# Patient Record
Sex: Female | Born: 1965 | Race: Black or African American | Hispanic: No | Marital: Married | Smoking: Never smoker
Health system: Southern US, Community
[De-identification: ages and names within clinical notes are randomized; demographics above are authoritative.]

## PROBLEM LIST (undated history)

## (undated) HISTORY — PX: ABDOMINAL HYSTERECTOMY: SHX81

---

## 1999-05-02 ENCOUNTER — Other Ambulatory Visit: Admission: RE | Admit: 1999-05-02 | Discharge: 1999-05-02 | Payer: Self-pay | Admitting: Obstetrics

## 2000-09-09 ENCOUNTER — Other Ambulatory Visit: Admission: RE | Admit: 2000-09-09 | Discharge: 2000-09-09 | Payer: Self-pay | Admitting: Obstetrics

## 2001-01-15 ENCOUNTER — Ambulatory Visit (HOSPITAL_COMMUNITY): Admission: RE | Admit: 2001-01-15 | Discharge: 2001-01-15 | Payer: Self-pay | Admitting: Obstetrics

## 2001-01-15 ENCOUNTER — Encounter: Payer: Self-pay | Admitting: Obstetrics

## 2001-04-11 ENCOUNTER — Inpatient Hospital Stay (HOSPITAL_COMMUNITY): Admission: AD | Admit: 2001-04-11 | Discharge: 2001-04-13 | Payer: Self-pay | Admitting: Obstetrics

## 2005-04-13 ENCOUNTER — Ambulatory Visit (HOSPITAL_COMMUNITY): Admission: RE | Admit: 2005-04-13 | Discharge: 2005-04-13 | Payer: Self-pay | Admitting: Obstetrics

## 2007-11-12 ENCOUNTER — Other Ambulatory Visit: Admission: RE | Admit: 2007-11-12 | Discharge: 2007-11-12 | Payer: Self-pay | Admitting: Family Medicine

## 2007-11-14 ENCOUNTER — Encounter: Admission: RE | Admit: 2007-11-14 | Discharge: 2007-11-14 | Payer: Self-pay | Admitting: Family Medicine

## 2007-12-11 ENCOUNTER — Encounter (INDEPENDENT_AMBULATORY_CARE_PROVIDER_SITE_OTHER): Payer: Self-pay | Admitting: Obstetrics and Gynecology

## 2007-12-11 ENCOUNTER — Inpatient Hospital Stay (HOSPITAL_COMMUNITY): Admission: RE | Admit: 2007-12-11 | Discharge: 2007-12-14 | Payer: Self-pay | Admitting: Obstetrics and Gynecology

## 2008-10-22 ENCOUNTER — Ambulatory Visit (HOSPITAL_COMMUNITY): Admission: RE | Admit: 2008-10-22 | Discharge: 2008-10-22 | Payer: Self-pay | Admitting: Family Medicine

## 2009-04-26 IMAGING — US US TRANSVAGINAL NON-OB
1 series · 13 of 25 positions shown · non-contrast
Comparison: None

CLINICAL DATA: General pelvic pain

TRANSABDOMINAL AND TRANSVAGINAL ULTRASOUND OF PELVIS
TECHNIQUE: Both transabdominal and transvaginal ultrasound
examinations of the pelvis were performed including evaluation of
the uterus, ovaries, adnexal regions, and pelvic cul-de-sac.

[Series 1: us transvaginal non-ob · 0.39mm/px · 13 of 75 slices shown]
[im 1/75]
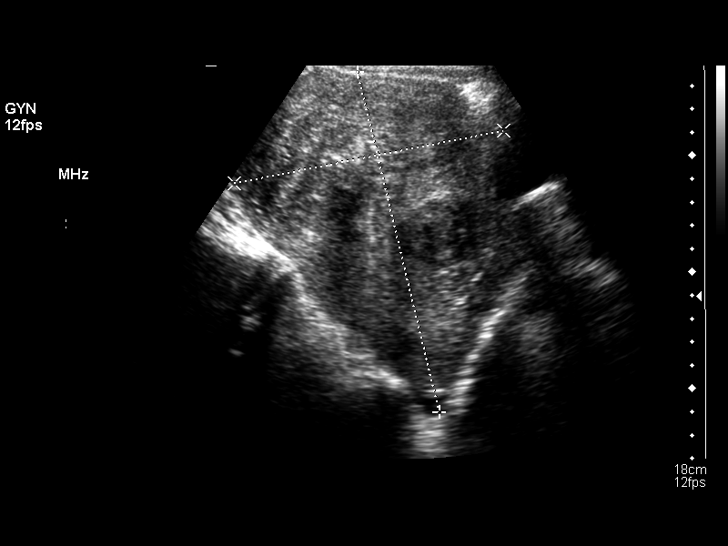
[im 7/75]
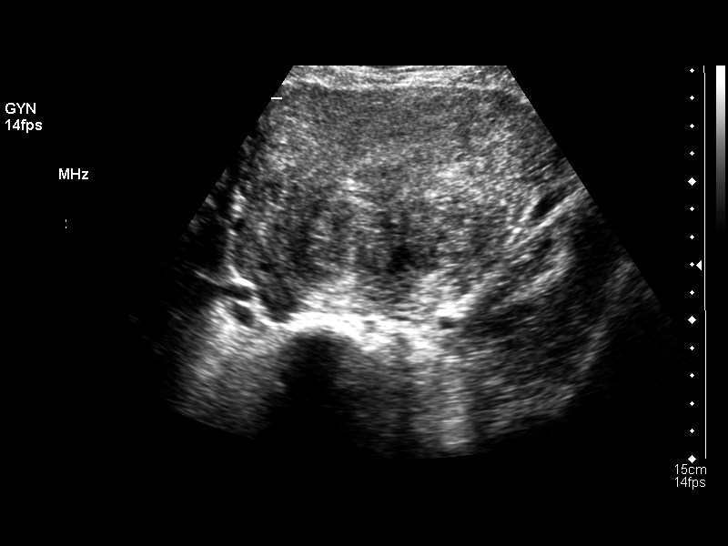
[im 13/75]
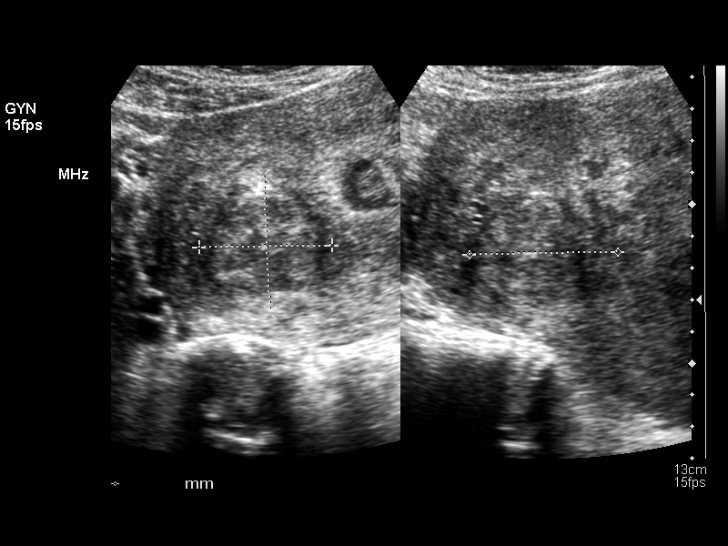
[im 19/75]
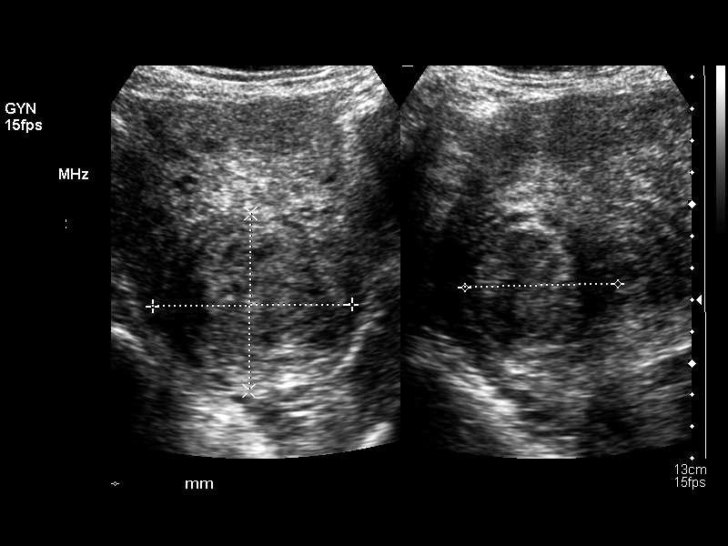
[im 25/75]
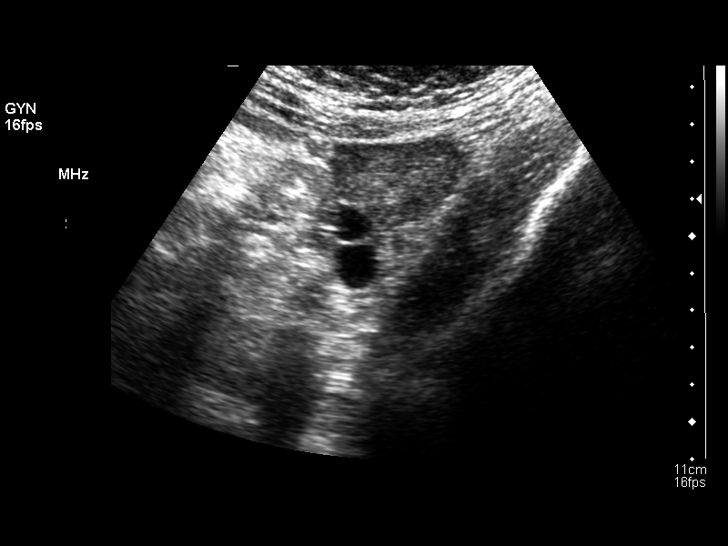
[im 31/75]
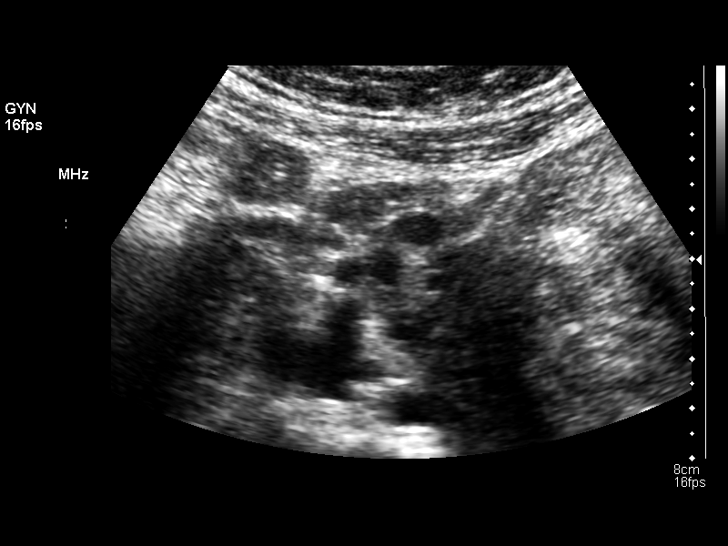
[im 38/75]
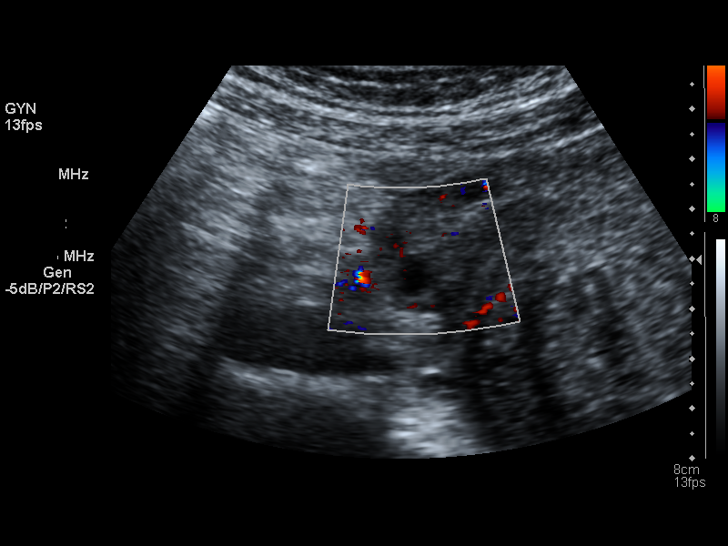
[im 44/75]
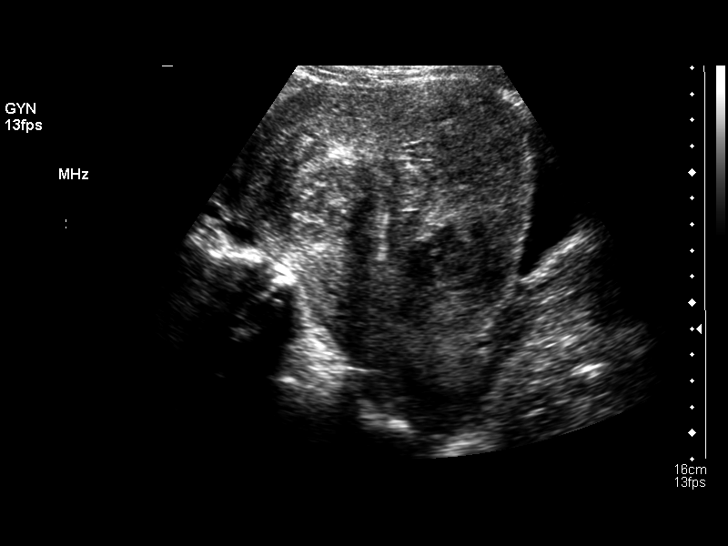
[im 50/75]
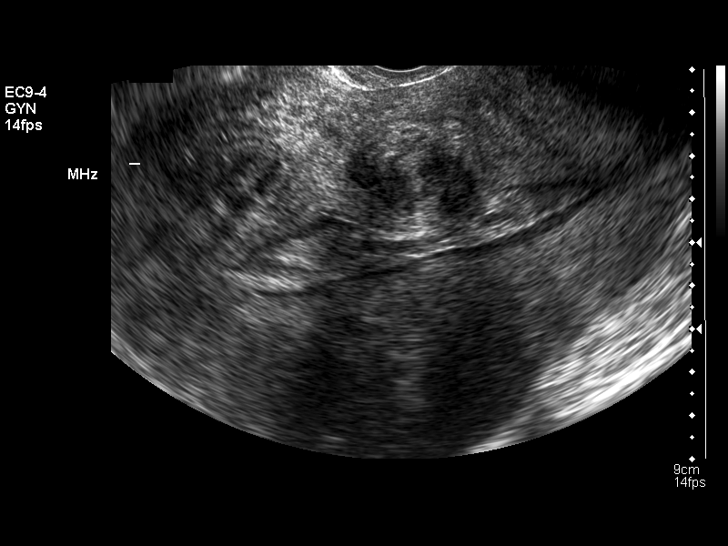
[im 56/75]
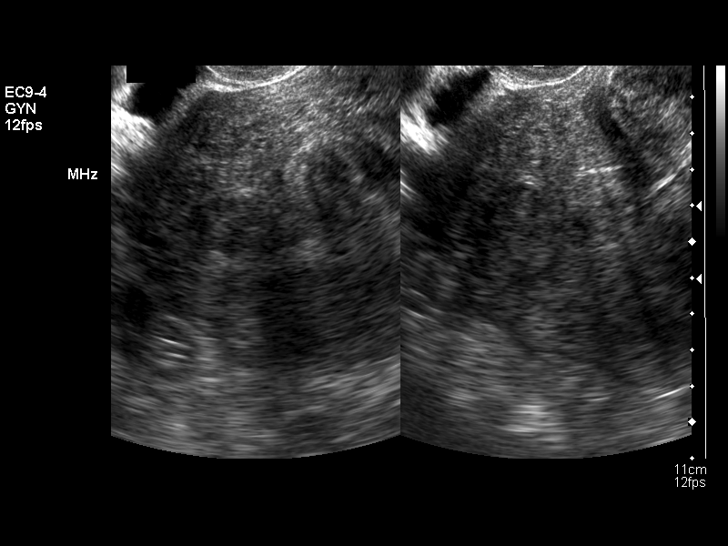
[im 62/75]
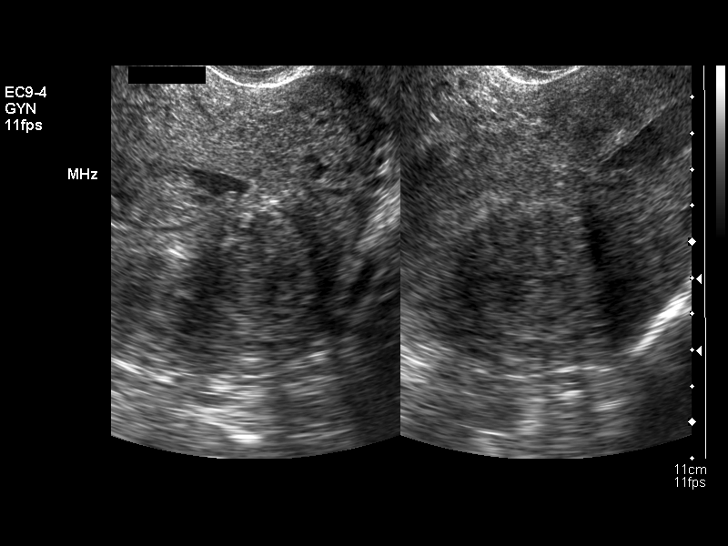
[im 68/75]
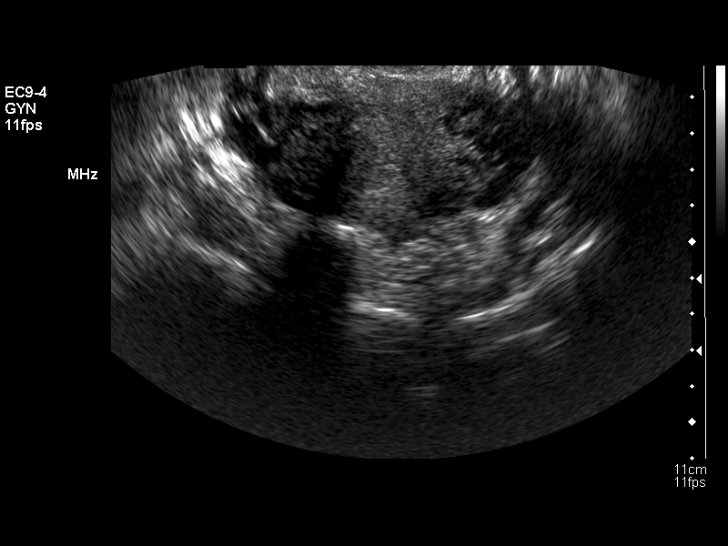
[im 75/75]
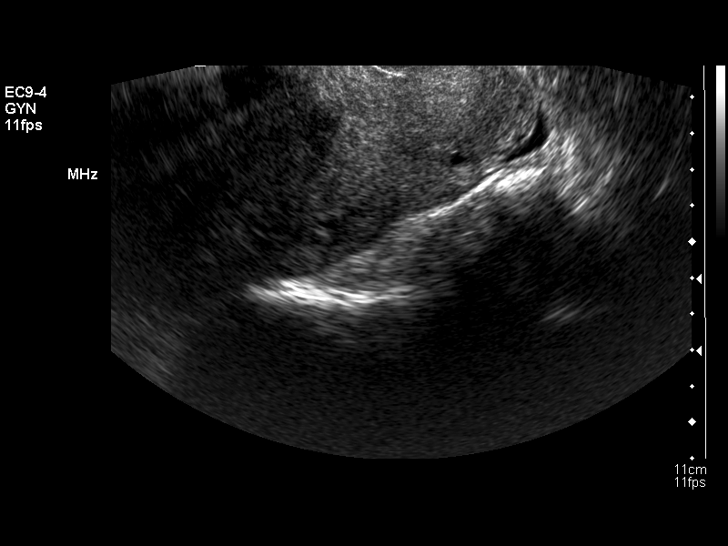

[13 of 25 positions shown; findings below may reference images not displayed]

FINDINGS: The uterus measures 16.1 x 10.4 x 10.7 cm.  Myometrial echotexture
is heterogeneous.  Multiple fibroids are identified.  The largest
is an intramural lesion in the right body of the uterus measuring
5.5 cm.  The other fibroids range in size from 3.3 cm up to 4.0 cm.
Approximately 6-7 additional fibroids are evident.  In the left mid
uterus, a 3.6 cm fibroid generates mass effect on the endometrial
canal and may be submucosal in position.  The remaining fibroids
appear intramural or subserosal.  No pedunculated lesions are
evident..  Endometrial stripe thickness is 15 mm.

The right ovary measures 3.3 x 2.9 x 1.9 cm.  The left ovary
measures 5.1 x 3.1 x 3.8 cm.  A small 9 mm hyperechoic focus is
identified in the right ovary.  This could be a calcification or
tiny ovarian dermoid..  The patient has a trace amount of simple
appearing free fluid in the cul-de-sac..
IMPRESSION: Enlarged uterus with multiple fibroids identified.  3.6 cm fibroid
in the left mid uterus appears to be submucosal in location.  The
fibroid burden distorts the endometrial canal.

Endometrial stripe is 15 mm in thickness.  This can be a
physiologic appearance in a premenopausal female.

Tiny hyperechoic focus in the right ovary could be a parenchymal
calcification or tiny dermoid.

## 2010-11-14 NOTE — Op Note (Signed)
[name], Tamara Horton NO.:  [id number]   MEDICAL RECORD NO.:  [id number]          PATIENT TYPE:  INP   LOCATION:  9304                          FACILITY:  [facility]   PHYSICIAN:  Miguel Aschoff, M.D.       DATE OF BIRTH:  [birth year]   DATE OF PROCEDURE:  12/11/2007  DATE OF DISCHARGE:                               OPERATIVE REPORT   PREOPERATIVE DIAGNOSES:  1. Large symptomatic uterine fibroids.  2. Menorrhagia.   POSTOPERATIVE DIAGNOSES:  1. Large symptomatic uterine fibroids.  2. Menorrhagia.    Dictation ended at this point.      Miguel Aschoff, M.D.     AR/MEDQ  D:  12/11/2007  T:  12/12/2007  Job:  [id number]

## 2010-11-14 NOTE — Op Note (Signed)
NAMEPEG, FIFER NO.:  [id number]   MEDICAL RECORD NO.:  [id number]          PATIENT TYPE:  INP   LOCATION:  9399                          FACILITY:  [facility]   PHYSICIAN:  Miguel Aschoff, M.D.       DATE OF BIRTH:  06/14/1966   DATE OF PROCEDURE:  12/11/2007  DATE OF DISCHARGE:                               OPERATIVE REPORT   PREOPERATIVE DIAGNOSIS:  Symptomatic uterine fibroids with menorrhagia.   POSTOPERATIVE DIAGNOSIS:  Symptomatic uterine fibroids with menorrhagia.   PROCEDURE:  Total abdominal hysterectomy.   SURGEON:  Miguel Aschoff, MD   ANESTHESIA:  General.   COMPLICATIONS:  None.   JUSTIFICATION:  The patient is a 45 year old black female gravida 3,  para 2-0-1-2 noted on examination to have very large uterine fibroids  between 16-18 weeks' equivalent size.  This has been associated with  menorrhagia and significant anemia for which the patient is currently on  iron therapy.  Because of the size of the uterine fibroids and continued  heavy menses, she presents now to undergo definitive therapy via total  abdominal hysterectomy.  The risks and benefits of procedure were  discussed with the patient and her husband.  Plan is for the patient to  undergo hysterectomy.  The ovaries will be conserved if normal.  Informed consent has been obtained.   PROCEDURE IN DETAIL:  The patient was taken to the operating and placed  in supine position.  General anesthesia was administered without  difficulty.  She was then placed in dorsal lithotomy position, prepped,  and draped in the usual sterile fashion.  Once this was done, a  Pfannenstiel incision was made.  Prior to the incision, a Foley catheter  had been placed.  At the incision, bleeding points were coagulated with  electrocautery and the incision was extended down through the  subcutaneous tissue.  The fascia was then identified, incised  transversely, and separated from the underlying rectus muscles.   Rectus  muscles were divided in the midline.  The peritoneum was then found and  opened carefully avoiding underlying structures.  At this point, a self-  retaining retractor was placed through the wound.  The uterus again was  noted be markedly enlarged extending up to the umbilicus.  It was  delivered through the incision.  At this point, the round ligaments were  identified, clamped, cut, and suture ligated using suture ligatures of 0  Vicryl.  The anterior bladder flap was then created.  Perforations were  then made in the broad ligament below the utero-ovarian ligament and  this ligament was then clamped with Heaney clamps as were the tubes.  Then the pedicles were cut and suture ligated using suture ligatures of  0 Vicryl and then free ties of 0 Vicryl.  After this was done, the broad  ligament skeletonized further until the uterine vessels could be found.  These were clamped with curved Heaney clamps.  These pedicles were then  cut and suture ligated using suture ligatures of 0 Vicryl.  Because of  the large size of the uterine fundus blocking the surgical field  into  the pelvis, it was elected at this point to proceed with removing the  fundus.  This was done without difficulty.  The cervical stump was then  grasped with Kocher clamps, held, and elevated.  At this point, the  bladder flap was taken down further.  Then the paracervical fascia was  clamped using straight Heaney clamps.  These pedicles were cut and  suture ligated using suture ligatures of 0 Vicryl.  This was done in  serial fashion.  The uterosacral ligaments were then found, clamped,  cut, and suture ligated in similar fashion.  Once this was done, the  reflection of the vagina into the cervix was identified and then with  curved Heaney clamps, the reflection was cross clamped and at this point  the specimen was excised, the specimen now consisting of the cervical  stump.  The angles of the cuff were then ligated  using figure-of-eight  sutures of 0 Vicryl suture.  The uterosacral ligaments were incorporated  for support.  The vaginal cuff was then closed using running  interlocking 0 Vicryl suture.  Inspection was made for hemostasis.  Hemostasis appeared to be excellent and at this point the pelvic floor  was reperitonealized.  On reperitonealization, it was noted that there  was a small amount of bleeding coming from a segment of the right  fallopian tube.  This area was then clamped with a Kelly clamp and a  tubal segment excised and then this area of bleeding was ligated using a  ligature of 2-0 Vicryl and promptly brought this under control.  At this  point, there were no other abnormalities noted.  There was good  hemostasis and it was elected to close the abdomen.  Lap counts and  instrument counts were taken and found to be correct.  The parietal  peritoneum was then closed using running continuous 0 Vicryl suture.  The rectus muscles were reapproximated using running continuous 0 Vicryl  suture.  The fascia was then closed using 2 sutures of 0 Vicryl each  starting at lateral fascial angles and meeting in the midline.  Subcutaneous tissue was closed using interrupted 0 Vicryl suture and the  skin incision was closed using subcuticular 3-0 Vicryl suture.  The  estimated blood loss was approximately 200 mL.  The patient tolerated  the procedure well and went to the recovery room in satisfactory  condition.      Miguel Aschoff, M.D.  Electronically Signed     AR/MEDQ  D:  12/11/2007  T:  12/12/2007  Job:  [id number]

## 2010-11-17 NOTE — Discharge Summary (Signed)
Tamara Horton, REASNER NO.:  [id number]   MEDICAL RECORD NO.:  [id number]          PATIENT TYPE:  INP   LOCATION:  9304                          FACILITY:  [facility]   PHYSICIAN:  Miguel Aschoff, M.D.       DATE OF BIRTH:  01-Aug-1965   DATE OF ADMISSION:  12/11/2007  DATE OF DISCHARGE:  12/14/2007                               DISCHARGE SUMMARY   ADMISSION DIAGNOSIS:  Large uterine fibroids.   OPERATIONS AND PROCEDURES:  Total abdominal hysterectomy, right  salpingectomy.   BRIEF HISTORY:  The patient is a 44 year old black female who presented  with a history of very heavy menses and on examination was noted to have  a large pelvic mass extending up almost to the umbilicus.  Mass was  consistent with 16-18 weeks sized uterine fibroids.  Because of the size  of fibroids and associated bleeding, it was suggested that the patient  undergo definitive therapy via hysterectomy.  The risks and benefits of  procedure were discussed with the patient as well as the alternatives of  fibroid embolization.  The patient opted at this point to have the  surgery performed and informed consent was obtained.  Preoperative  studies were obtained which revealed admission hemoglobin of 12.2, white  count 4300.  PT and PTT were within normal limits as was the chemistry  profile.  Urinalysis was essentially negative.   HOSPITAL COURSE:  Under general anesthesia on December 11, 2007, a total  abdominal hysterectomy was carried out without difficulty.  At the time  of surgery, the patient was noted to have massive uterine fibroids  weighing 1098 grams with uterus measuring 17 x 15 x 12.5 centimeters  with the cervix measuring 4.57 cm x 3 cm.  The patient's postoperative  course was essentially uncomplicated.  She did tolerate increasing  ambulation and diet well.  By the third postoperative day was in  satisfactory condition, and able to be discharged home.  Her hemoglobin  remained stable at 11.9.   She was sent home on December 14, 2007, in  satisfactory condition.  She was instructed to do no heavy lifting,  place nothing in vagina, and call if there are any problems such as  fever, pain or heavy bleeding.  Her medications for home included Tylox  1 every 3 hours need for pain.  Her condition on discharge was improved.  She was sent home on a regular diet.  The final pathology report on the  hysterectomy specimen revealed leiomyomata, benign inactive endometrium  with  pseudo-decidualized stroma.  Cervix showed benign squamous mucosa,  no dysplasia was noted, transection of fallopian tube did not reveal any  pathologic diagnosis.      Miguel Aschoff, M.D.  Electronically Signed     AR/MEDQ  D:  01/01/2008  T:  01/01/2008  Job:  [id number]

## 2010-11-17 NOTE — Discharge Summary (Signed)
Tamara Horton, LEISEY NO.:  [id number]   MEDICAL RECORD NO.:  [id number]          PATIENT TYPE:  INP   LOCATION:  9304                          FACILITY:  [facility]   PHYSICIAN:  Miguel Aschoff, M.D.       DATE OF BIRTH:  1966/06/22   DATE OF ADMISSION:  12/11/2007  DATE OF DISCHARGE:  12/14/2007                               DISCHARGE SUMMARY   ADMISSION DIAGNOSIS:  Symptomatic uterine fibroids.   OPERATIONS AND PROCEDURES:  Total abdominal hysterectomy.   BRIEF HISTORY:  The patient is a 45 year old black female who presented  with history of lower abdominal pain, prolonged heavy menstrual cycles  and on evaluation was noted to have a large mass extending two-thirds of  the way up to the umbilicus from the pelvis consistent with 16 to 18-  week uterine leiomyomas.  Because of the symptoms associated with these  fibroids, as well as her size, pain, and bleeding, the patient requested  that treatment be undertaken and was told about total abdominal  hysterectomy for treatment of uterine fibroids.  She was also given  another alternative, such as ablation of fibroids using invasive  radiology.  She opted for hysterectomy and was admitted to hospital for  this procedure.   HOSPITAL COURSE:  Preoperative studies were obtained, and on December 11, 2007, under general anesthesia, the patient underwent total abdominal  hysterectomy without difficulty.  The findings of the surgery revealed  markedly enlarged uterus with a gross weight of 1098 grams.  Her tubes  and ovaries were otherwise normal.  The surgical procedure was carried  out without difficulty.  The patient had an essentially uneventful  postoperative course tolerating increasing ambulation and diet well, and  by the third postoperative day, the patient was tolerating a regular  diet.  Bowels were working.  She was no longer on IV pain medications,  and felt to be stable enough for discharge.  She was discharged home  on  December 14, 2007, on Tylox one every 3 hours as needed for pain.  She was  instructed no heavy lifting, to place nothing in the vagina, and to call  if there were any problems such as fever, pain, or heavy bleeding.  She  will be seen back in 4 weeks for followup examination.  Final pathology  report on the hysterectomy specimen revealed the cervix and uterus  consistent with leiomyomata.  There was a pseudodecidualized  endometrium.  No hyperplasia was noted.  Cervix had unremarkable  pathology.  The surgical specimen was 1061 grams at the Pathology  Department.      Miguel Aschoff, M.D.  Electronically Signed     AR/MEDQ  D:  12/19/2007  T:  12/19/2007  Job:  [id number]

## 2011-03-29 LAB — APTT: aPTT: 25

## 2011-03-29 LAB — CBC
Hemoglobin: 12.2
MCHC: 33.4
MCHC: 33.8
MCV: 98.8
MCV: 98.8
MCV: 98.9
Platelets: 155
RBC: 3.46 — ABNORMAL LOW
RBC: 3.56 — ABNORMAL LOW
RBC: 3.68 — ABNORMAL LOW
RDW: 14.9
RDW: 15
WBC: 5.8

## 2011-03-29 LAB — URINALYSIS, ROUTINE W REFLEX MICROSCOPIC
Bilirubin Urine: NEGATIVE
Ketones, ur: NEGATIVE
Leukocytes, UA: NEGATIVE
Nitrite: NEGATIVE
Protein, ur: NEGATIVE
Urobilinogen, UA: 0.2
pH: 6

## 2011-03-29 LAB — PROTIME-INR
INR: 1
Prothrombin Time: 12.9

## 2011-03-29 LAB — COMPREHENSIVE METABOLIC PANEL
ALT: 10
CO2: 26
Calcium: 8.8
Creatinine, Ser: 0.78
GFR calc non Af Amer: >60
Glucose, Bld: 91
Sodium: 134 — ABNORMAL LOW
Total Protein: 6.5

## 2011-03-29 LAB — HCG, SERUM, QUALITATIVE: Preg, Serum: NEGATIVE

## 2011-11-16 ENCOUNTER — Other Ambulatory Visit: Payer: Self-pay | Admitting: Obstetrics and Gynecology

## 2015-05-02 ENCOUNTER — Emergency Department (HOSPITAL_BASED_OUTPATIENT_CLINIC_OR_DEPARTMENT_OTHER)
Admission: EM | Admit: 2015-05-02 | Discharge: 2015-05-02 | Disposition: A | Discharge status: Home / Self Care | Payer: Worker's Compensation | Attending: Emergency Medicine | Admitting: Emergency Medicine

## 2015-05-02 ENCOUNTER — Encounter (HOSPITAL_BASED_OUTPATIENT_CLINIC_OR_DEPARTMENT_OTHER): Payer: Self-pay

## 2015-05-02 DIAGNOSIS — Y998 Other external cause status: Secondary | ICD-10-CM | POA: Insufficient documentation

## 2015-05-02 DIAGNOSIS — X509XXA Other and unspecified overexertion or strenuous movements or postures, initial encounter: Secondary | ICD-10-CM | POA: Diagnosis not present

## 2015-05-02 DIAGNOSIS — S3992XA Unspecified injury of lower back, initial encounter: Secondary | ICD-10-CM | POA: Insufficient documentation

## 2015-05-02 DIAGNOSIS — Y9389 Activity, other specified: Secondary | ICD-10-CM | POA: Diagnosis not present

## 2015-05-02 DIAGNOSIS — M549 Dorsalgia, unspecified: Secondary | ICD-10-CM

## 2015-05-02 DIAGNOSIS — Y9289 Other specified places as the place of occurrence of the external cause: Secondary | ICD-10-CM | POA: Diagnosis not present

## 2015-05-02 MED ORDER — METHOCARBAMOL 500 MG PO TABS: 500.0000 mg | ORAL_TABLET | Freq: Two times a day (BID) | ORAL | Status: AC

## 2015-05-02 MED ORDER — METHOCARBAMOL 500 MG PO TABS
500.0000 mg | ORAL_TABLET | Freq: Once | ORAL | Status: AC
  Administered 2015-05-02: 500 mg via ORAL
  Filled 2015-05-02: qty 1

## 2015-05-02 MED ORDER — IBUPROFEN 800 MG PO TABS: 800.0000 mg | ORAL_TABLET | Freq: Three times a day (TID) | ORAL | Status: AC

## 2015-05-02 MED ORDER — IBUPROFEN 800 MG PO TABS
800.0000 mg | ORAL_TABLET | Freq: Once | ORAL | Status: AC
  Administered 2015-05-02: 800 mg via ORAL
  Filled 2015-05-02: qty 1

## 2015-05-02 NOTE — ED Provider Notes (Signed)
CSN: [id number]     Arrival date & time 05/02/15  1306 History   First MD Initiated Contact with Patient 05/02/15 1402     Chief Complaint  Patient presents with  . Back Pain    HPI  Tamara Horton is a 49 y.o. female with no pertinent PMH who presents to the ED with back pain. She states she was lifting heavy boxes at work today when her pain started. She reports movement exacerbates her pain. She tried aleve for symptom relief, which was minimally effective. She denies fever, chills, headache, lightheadedness, dizziness, chest pain, shortness of breath, abdominal pain, nausea, vomiting, diarrhea, constipation, bowel or bladder incontinence, saddle anesthesia, numbness, weakness, paresthesia, history of malignancy, history of IV drug use, anticoagulant use.   History reviewed. No pertinent past medical history. Past Surgical History  Procedure Laterality Date  . Abdominal hysterectomy     No family history on file. Social History  Substance Use Topics  . Smoking status: Never Smoker   . Smokeless tobacco: None  . Alcohol Use: No   OB History    No data available      Review of Systems  Constitutional: Negative for fever and chills.  Respiratory: Negative for shortness of breath.   Cardiovascular: Negative for chest pain.  Gastrointestinal: Negative for nausea, vomiting, abdominal pain, diarrhea and constipation.  Musculoskeletal: Positive for back pain. Negative for neck pain and neck stiffness.  Neurological: Negative for dizziness, light-headedness, numbness and headaches.  All other systems reviewed and are negative.     Allergies  Review of patient's allergies indicates no known allergies.  Home Medications   Prior to Admission medications   Medication Sig Start Date End Date Taking? Authorizing Provider  ibuprofen (ADVIL,MOTRIN) 800 MG tablet Take 1 tablet (800 mg total) by mouth 3 (three) times daily. 05/02/15   Mady GemmaElizabeth C Westfall, PA-C  methocarbamol  (ROBAXIN) 500 MG tablet Take 1 tablet (500 mg total) by mouth 2 (two) times daily. 05/02/15   Mady GemmaElizabeth C Westfall, PA-C    BP 122/71 mmHg  Pulse 68  Temp(Src) 98.3 F (36.8 C) (Oral)  Resp 16  Ht 6' (1.829 m)  Wt 190 lb (86.183 kg)  BMI 25.76 kg/m2  SpO2 100% Physical Exam  Constitutional: She is oriented to person, place, and time. She appears well-developed and well-nourished. No distress.  HENT:  Head: Normocephalic and atraumatic.  Right Ear: External ear normal.  Left Ear: External ear normal.  Nose: Nose normal.  Mouth/Throat: Uvula is midline, oropharynx is clear and moist and mucous membranes are normal.  Eyes: Conjunctivae, EOM and lids are normal. Pupils are equal, round, and reactive to light. Right eye exhibits no discharge. Left eye exhibits no discharge. No scleral icterus.  Neck: Normal range of motion. Neck supple.  Cardiovascular: Normal rate, regular rhythm, normal heart sounds, intact distal pulses and normal pulses.   Pulmonary/Chest: Effort normal and breath sounds normal. No respiratory distress. She has no wheezes. She has no rales.  Abdominal: Soft. Normal appearance and bowel sounds are normal. She exhibits no distension and no mass. There is no tenderness. There is no rigidity, no rebound and no guarding.  Musculoskeletal: Normal range of motion. She exhibits tenderness. She exhibits no edema.  TTP of left lumbar paraspinal muscles. No midline tenderness, step-off, or deformity. Strength and sensation intact. Distal pulses intact.  Neurological: She is alert and oriented to person, place, and time. She has normal strength. No cranial nerve deficit or sensory deficit.  Skin: Skin is warm, dry and intact. No rash noted. She is not diaphoretic. No erythema. No pallor.  Psychiatric: She has a normal mood and affect. Her speech is normal and behavior is normal.  Nursing note and vitals reviewed.   ED Course  Procedures (including critical care time)  Labs  Review Labs Reviewed - No data to display  Imaging Review No results found.    EKG Interpretation None      MDM   Final diagnoses:  Back pain, unspecified location    49 year old female presents with left low back pain, which started after lifting heavy boxes at work today. Denies fever, chills, headache, lightheadedness, dizziness, chest pain, shortness of breath, abdominal pain, nausea, vomiting, diarrhea, constipation, bowel or bladder incontinence, saddle anesthesia, numbness, weakness, paresthesia, history of malignancy, history of IV drug use, anticoagulant use.  Patient is afebrile. Vital signs stable. Heart regular rate and rhythm. Lungs clear to auscultation bilaterally. Abdomen soft, nontender, nondistended. Tenderness to palpation of left lumbar paraspinal muscles with palpable spasm. No midline tenderness, step-off, or deformity. Strength and sensation intact. Distal pulses intact. Patient able to ambulate, though this causes her pain.  Low suspicion for cauda equina, abscess, hematoma. Symptoms most likely due to muscular strain. Will treat with ibuprofen and robaxin. Patient to follow up with PCP. Strict return precautions discussed. Patient verbalizes her understanding and is in agreement with plan.  BP 122/71 mmHg  Pulse 68  Temp(Src) 98.3 F (36.8 C) (Oral)  Resp 16  Ht 6' (1.829 m)  Wt 190 lb (86.183 kg)  BMI 25.76 kg/m2  SpO2 100%    Mady Gemma, PA-C 05/02/15 1638  Arby Barrette, MD 05/03/15 [id number]

## 2015-05-02 NOTE — ED Notes (Addendum)
C/o lower back pain after lifting boxes at work this am-pt seated in EDWR-to/from triage via w/c per request

## 2015-05-02 NOTE — Discharge Instructions (Signed)
1. Medications: ibuprofen, robaxin, usual home medications 2. Treatment: rest, drink plenty of fluids, back exercises 3. Follow Up: please followup with your primary doctor this week for discussion of your diagnoses and further evaluation after today's visit; please return to the ER for severe pain, numbness, weakness, loss of control of your bowels or bladder, new or worsening symptoms   Back Exercises The following exercises strengthen the muscles that help to support the back. They also help to keep the lower back flexible. Doing these exercises can help to prevent back pain or lessen existing pain. If you have back pain or discomfort, try doing these exercises 2-3 times each day or as told by your health care provider. When the pain goes away, do them once each day, but increase the number of times that you repeat the steps for each exercise (do more repetitions). If you do not have back pain or discomfort, do these exercises once each day or as told by your health care provider. EXERCISES Single Knee to Chest Repeat these steps 3-5 times for each leg:  Lie on your back on a firm bed or the floor with your legs extended.  Bring one knee to your chest. Your other leg should stay extended and in contact with the floor.  Hold your knee in place by grabbing your knee or thigh.  Pull on your knee until you feel a gentle stretch in your lower back.  Hold the stretch for 10-30 seconds.  Slowly release and straighten your leg. Pelvic Tilt Repeat these steps 5-10 times:  Lie on your back on a firm bed or the floor with your legs extended.  Bend your knees so they are pointing toward the ceiling and your feet are flat on the floor.  Tighten your lower abdominal muscles to press your lower back against the floor. This motion will tilt your pelvis so your tailbone points up toward the ceiling instead of pointing to your feet or the floor.  With gentle tension and even breathing, hold this  position for 5-10 seconds. Cat-Cow Repeat these steps until your lower back becomes more flexible:  Get into a hands-and-knees position on a firm surface. Keep your hands under your shoulders, and keep your knees under your hips. You may place padding under your knees for comfort.  Let your head hang down, and point your tailbone toward the floor so your lower back becomes rounded like the back of a cat.  Hold this position for 5 seconds.  Slowly lift your head and point your tailbone up toward the ceiling so your back forms a sagging arch like the back of a cow.  Hold this position for 5 seconds. Press-Ups Repeat these steps 5-10 times:  Lie on your abdomen (face-down) on the floor.  Place your palms near your head, about shoulder-width apart.  While you keep your back as relaxed as possible and keep your hips on the floor, slowly straighten your arms to raise the top half of your body and lift your shoulders. Do not use your back muscles to raise your upper torso. You may adjust the placement of your hands to make yourself more comfortable.  Hold this position for 5 seconds while you keep your back relaxed.  Slowly return to lying flat on the floor. Bridges Repeat these steps 10 times: 1. Lie on your back on a firm surface. 2. Bend your knees so they are pointing toward the ceiling and your feet are flat on the floor. 3.  Tighten your buttocks muscles and lift your buttocks off of the floor until your waist is at almost the same height as your knees. You should feel the muscles working in your buttocks and the back of your thighs. If you do not feel these muscles, slide your feet 1-2 inches farther away from your buttocks. 4. Hold this position for 3-5 seconds. 5. Slowly lower your hips to the starting position, and allow your buttocks muscles to relax completely. If this exercise is too easy, try doing it with your arms crossed over your chest. Abdominal Crunches Repeat these  steps 5-10 times: 1. Lie on your back on a firm bed or the floor with your legs extended. 2. Bend your knees so they are pointing toward the ceiling and your feet are flat on the floor. 3. Cross your arms over your chest. 4. Tip your chin slightly toward your chest without bending your neck. 5. Tighten your abdominal muscles and slowly raise your trunk (torso) high enough to lift your shoulder blades a tiny bit off of the floor. Avoid raising your torso higher than that, because it can put too much stress on your low back and it does not help to strengthen your abdominal muscles. 6. Slowly return to your starting position. Back Lifts Repeat these steps 5-10 times: 1. Lie on your abdomen (face-down) with your arms at your sides, and rest your forehead on the floor. 2. Tighten the muscles in your legs and your buttocks. 3. Slowly lift your chest off of the floor while you keep your hips pressed to the floor. Keep the back of your head in line with the curve in your back. Your eyes should be looking at the floor. 4. Hold this position for 3-5 seconds. 5. Slowly return to your starting position. SEEK MEDICAL CARE IF:  Your back pain or discomfort gets much worse when you do an exercise.  Your back pain or discomfort does not lessen within 2 hours after you exercise. If you have any of these problems, stop doing these exercises right away. Do not do them again unless your health care provider says that you can. SEEK IMMEDIATE MEDICAL CARE IF:  You develop sudden, severe back pain. If this happens, stop doing the exercises right away. Do not do them again unless your health care provider says that you can.   This information is not intended to replace advice given to you by your health care provider. Make sure you discuss any questions you have with your health care provider.   Document Released: 07/26/2004 Document Revised: 03/09/2015 Document Reviewed: 08/12/2014 Elsevier Interactive Patient  Education 2016 [employer]  Back Pain, Adult Back pain is very common in adults.The cause of back pain is rarely dangerous and the pain often gets better over time.The cause of your back pain may not be known. Some common causes of back pain include:  Strain of the muscles or ligaments supporting the spine.  Wear and tear (degeneration) of the spinal disks.  Arthritis.  Direct injury to the back. For many people, back pain may return. Since back pain is rarely dangerous, most people can learn to manage this condition on their own. HOME CARE INSTRUCTIONS Watch your back pain for any changes. The following actions may help to lessen any discomfort you are feeling:  Remain active. It is stressful on your back to sit or stand in one place for long periods of time. Do not sit, drive, or stand in one place for more  than 30 minutes at a time. Take short walks on even surfaces as soon as you are able.Try to increase the length of time you walk each day.  Exercise regularly as directed by your health care provider. Exercise helps your back heal faster. It also helps avoid future injury by keeping your muscles strong and flexible.  Do not stay in bed.Resting more than 1-2 days can delay your recovery.  Pay attention to your body when you bend and lift. The most comfortable positions are those that put less stress on your recovering back. Always use proper lifting techniques, including:  Bending your knees.  Keeping the load close to your body.  Avoiding twisting.  Find a comfortable position to sleep. Use a firm mattress and lie on your side with your knees slightly bent. If you lie on your back, put a pillow under your knees.  Avoid feeling anxious or stressed.Stress increases muscle tension and can worsen back pain.It is important to recognize when you are anxious or stressed and learn ways to manage it, such as with exercise.  Take medicines only as directed by your health care  provider. Over-the-counter medicines to reduce pain and inflammation are often the most helpful.Your health care provider may prescribe muscle relaxant drugs.These medicines help dull your pain so you can more quickly return to your normal activities and healthy exercise.  Apply ice to the injured area:  Put ice in a plastic bag.  Place a towel between your skin and the bag.  Leave the ice on for 20 minutes, 2-3 times a day for the first 2-3 days. After that, ice and heat may be alternated to reduce pain and spasms.  Maintain a healthy weight. Excess weight puts extra stress on your back and makes it difficult to maintain good posture. SEEK MEDICAL CARE IF:  You have pain that is not relieved with rest or medicine.  You have increasing pain going down into the legs or buttocks.  You have pain that does not improve in one week.  You have night pain.  You lose weight.  You have a fever or chills. SEEK IMMEDIATE MEDICAL CARE IF:   You develop new bowel or bladder control problems.  You have unusual weakness or numbness in your arms or legs.  You develop nausea or vomiting.  You develop abdominal pain.  You feel faint.   This information is not intended to replace advice given to you by your health care provider. Make sure you discuss any questions you have with your health care provider.   Document Released: 06/18/2005 Document Revised: 07/09/2014 Document Reviewed: 10/20/2013 Elsevier Interactive Patient Education [employer]

## 2015-05-02 NOTE — ED Notes (Addendum)
EMS transport from work [employer])- reports she was lifting boxes and has pain in low back- rated pain 4/10. Ambulatory with difficulty per EMS. Bp 120/60 [id number] CBG 85

## 2018-01-29 ENCOUNTER — Other Ambulatory Visit: Payer: Self-pay | Admitting: Physician Assistant

## 2018-01-29 DIAGNOSIS — Z1231 Encounter for screening mammogram for malignant neoplasm of breast: Secondary | ICD-10-CM

## 2018-03-10 ENCOUNTER — Ambulatory Visit
Admission: RE | Admit: 2018-03-10 | Discharge: 2018-03-10 | Disposition: A | Discharge status: Home / Self Care | Payer: BC Managed Care – PPO | Source: Ambulatory Visit | Attending: Physician Assistant | Admitting: Physician Assistant

## 2018-03-10 DIAGNOSIS — Z1231 Encounter for screening mammogram for malignant neoplasm of breast: Secondary | ICD-10-CM

## 2019-05-04 ENCOUNTER — Other Ambulatory Visit: Payer: Self-pay | Admitting: Physician Assistant

## 2019-05-04 DIAGNOSIS — Z1231 Encounter for screening mammogram for malignant neoplasm of breast: Secondary | ICD-10-CM

## 2019-06-23 ENCOUNTER — Ambulatory Visit
Admission: RE | Admit: 2019-06-23 | Discharge: 2019-06-23 | Disposition: A | Discharge status: Home / Self Care | Payer: BC Managed Care – PPO | Source: Ambulatory Visit | Attending: Physician Assistant | Admitting: Physician Assistant

## 2019-06-23 ENCOUNTER — Other Ambulatory Visit: Payer: Self-pay

## 2019-06-23 DIAGNOSIS — Z1231 Encounter for screening mammogram for malignant neoplasm of breast: Secondary | ICD-10-CM

## 2020-08-05 ENCOUNTER — Other Ambulatory Visit: Payer: Self-pay | Admitting: Physician Assistant

## 2020-08-05 DIAGNOSIS — Z1231 Encounter for screening mammogram for malignant neoplasm of breast: Secondary | ICD-10-CM

## 2020-08-08 ENCOUNTER — Ambulatory Visit
Admission: RE | Admit: 2020-08-08 | Discharge: 2020-08-08 | Disposition: A | Discharge status: Home / Self Care | Payer: BC Managed Care – PPO | Source: Ambulatory Visit | Attending: Physician Assistant | Admitting: Physician Assistant

## 2020-08-08 ENCOUNTER — Other Ambulatory Visit: Payer: Self-pay

## 2020-08-08 DIAGNOSIS — Z1231 Encounter for screening mammogram for malignant neoplasm of breast: Secondary | ICD-10-CM

## 2020-08-17 ENCOUNTER — Emergency Department (HOSPITAL_COMMUNITY)
Admission: EM | Admit: 2020-08-17 | Discharge: 2020-08-17 | Disposition: A | Discharge status: Home / Self Care | Payer: BC Managed Care – PPO | Attending: Emergency Medicine | Admitting: Emergency Medicine

## 2020-08-17 ENCOUNTER — Encounter (HOSPITAL_COMMUNITY): Payer: Self-pay | Admitting: Emergency Medicine

## 2020-08-17 DIAGNOSIS — Y92410 Unspecified street and highway as the place of occurrence of the external cause: Secondary | ICD-10-CM | POA: Diagnosis not present

## 2020-08-17 DIAGNOSIS — Z5321 Procedure and treatment not carried out due to patient leaving prior to being seen by health care provider: Secondary | ICD-10-CM | POA: Insufficient documentation

## 2020-08-17 DIAGNOSIS — R519 Headache, unspecified: Secondary | ICD-10-CM | POA: Diagnosis not present

## 2020-08-17 DIAGNOSIS — R110 Nausea: Secondary | ICD-10-CM | POA: Diagnosis not present

## 2020-08-17 NOTE — ED Notes (Signed)
Pt left due to wait time, I have moved pts name off the floor.
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
## 2021-08-18 ENCOUNTER — Other Ambulatory Visit: Payer: Self-pay | Admitting: Physician Assistant

## 2021-08-18 DIAGNOSIS — Z1231 Encounter for screening mammogram for malignant neoplasm of breast: Secondary | ICD-10-CM

## 2021-10-06 ENCOUNTER — Ambulatory Visit
Admission: RE | Admit: 2021-10-06 | Discharge: 2021-10-06 | Disposition: A | Discharge status: Home / Self Care | Payer: BC Managed Care – PPO | Source: Ambulatory Visit | Attending: Physician Assistant | Admitting: Physician Assistant

## 2021-10-06 DIAGNOSIS — Z1231 Encounter for screening mammogram for malignant neoplasm of breast: Secondary | ICD-10-CM

## 2022-01-19 IMAGING — MG MM DIGITAL SCREENING BILAT W/ TOMO AND CAD
6 of 10 series · 6 of 30 positions shown · non-contrast
Comparison: Previous exam(s).

CLINICAL DATA: Screening.

EXAM:
DIGITAL SCREENING BILATERAL MAMMOGRAM WITH TOMOSYNTHESIS AND CAD
TECHNIQUE: Bilateral screening digital craniocaudal and mediolateral oblique
mammograms were obtained. Bilateral screening digital breast
tomosynthesis was performed. The images were evaluated with
computer-aided detection.

[L CC synth-2D]
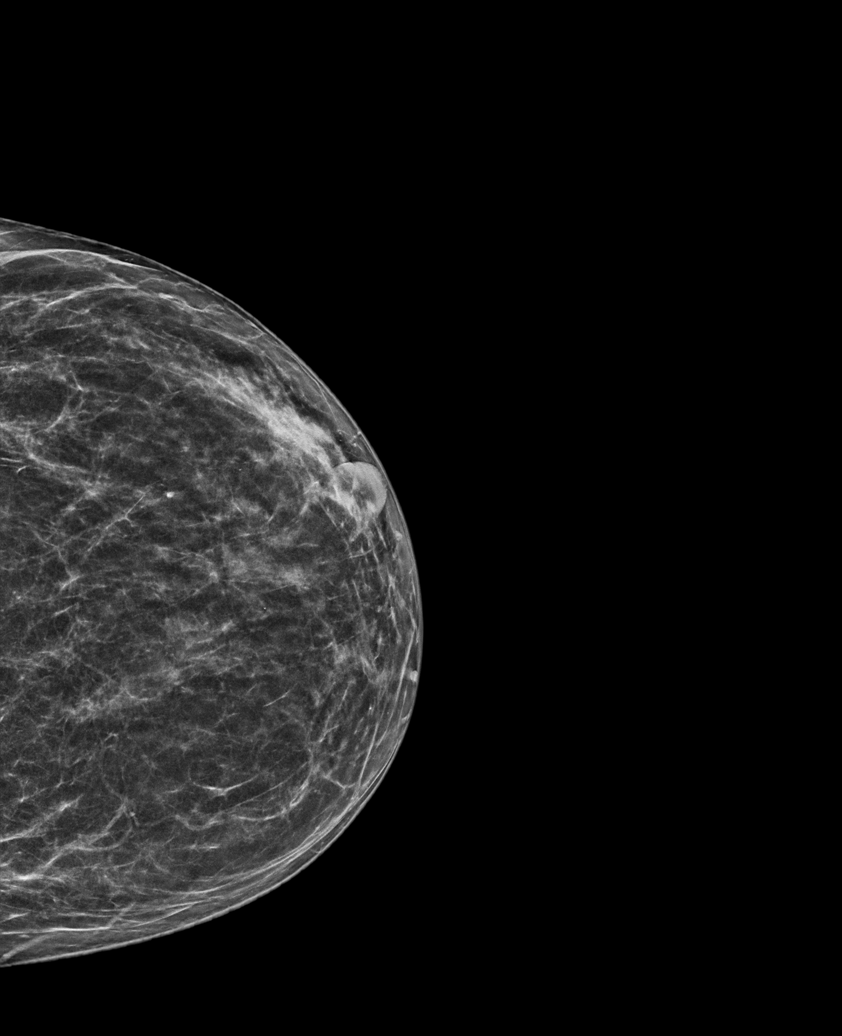

[R MLO synth-2D]
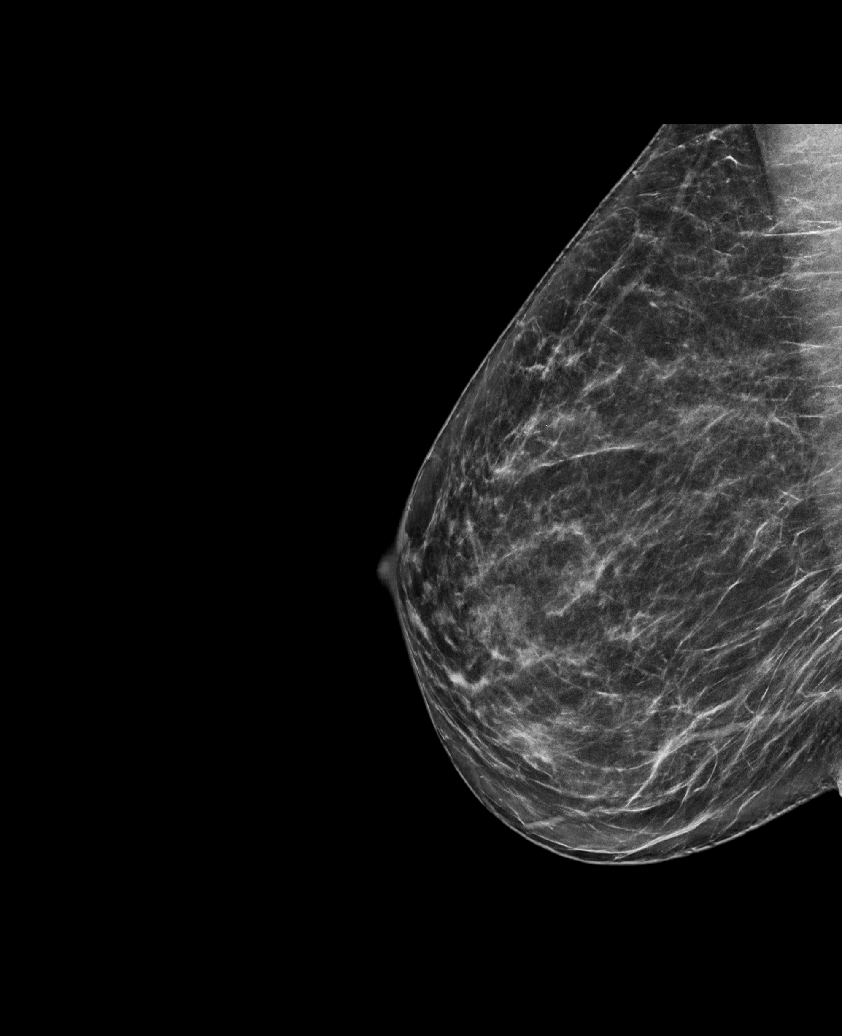

[R CC synth-2D]
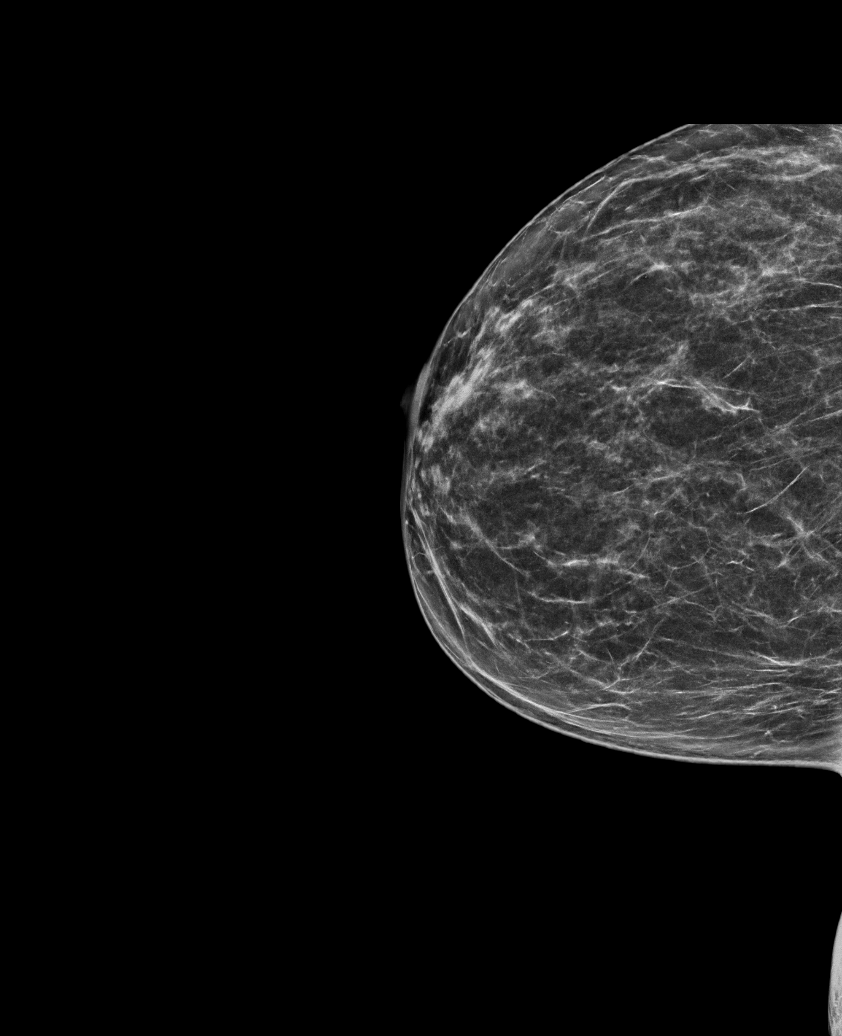

[L MLO synth-2D (1 of 2)]
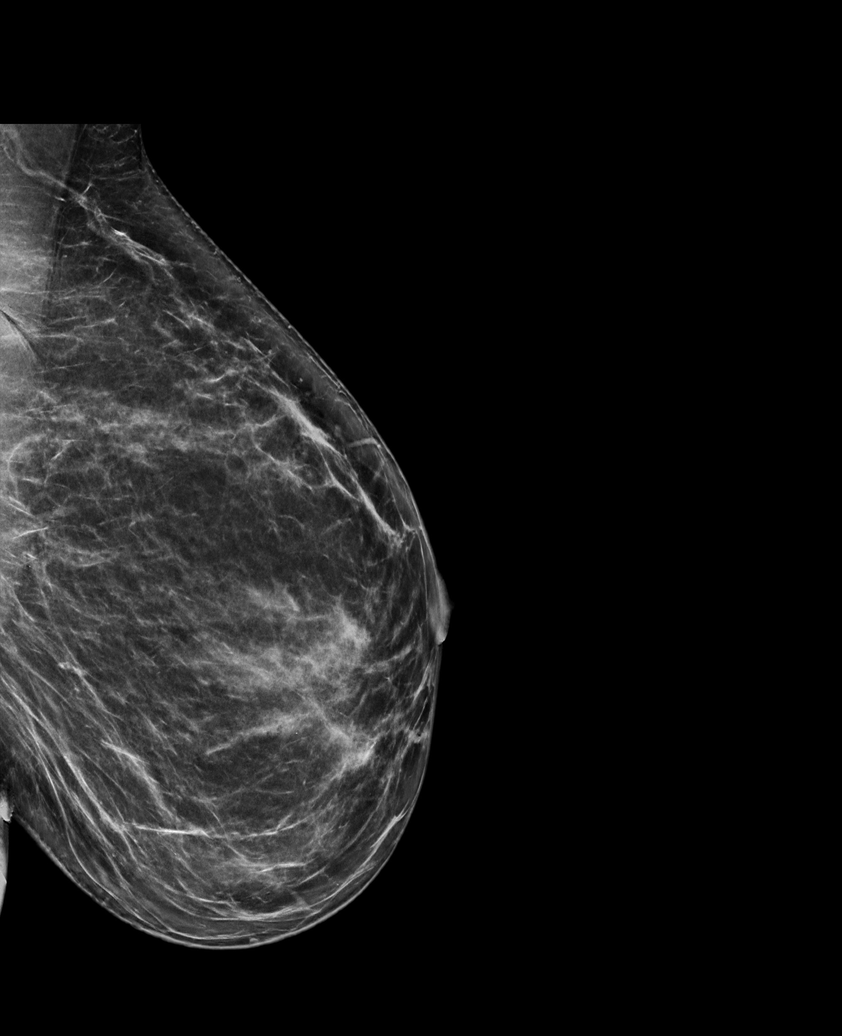

[L MLO synth-2D (2 of 2)]
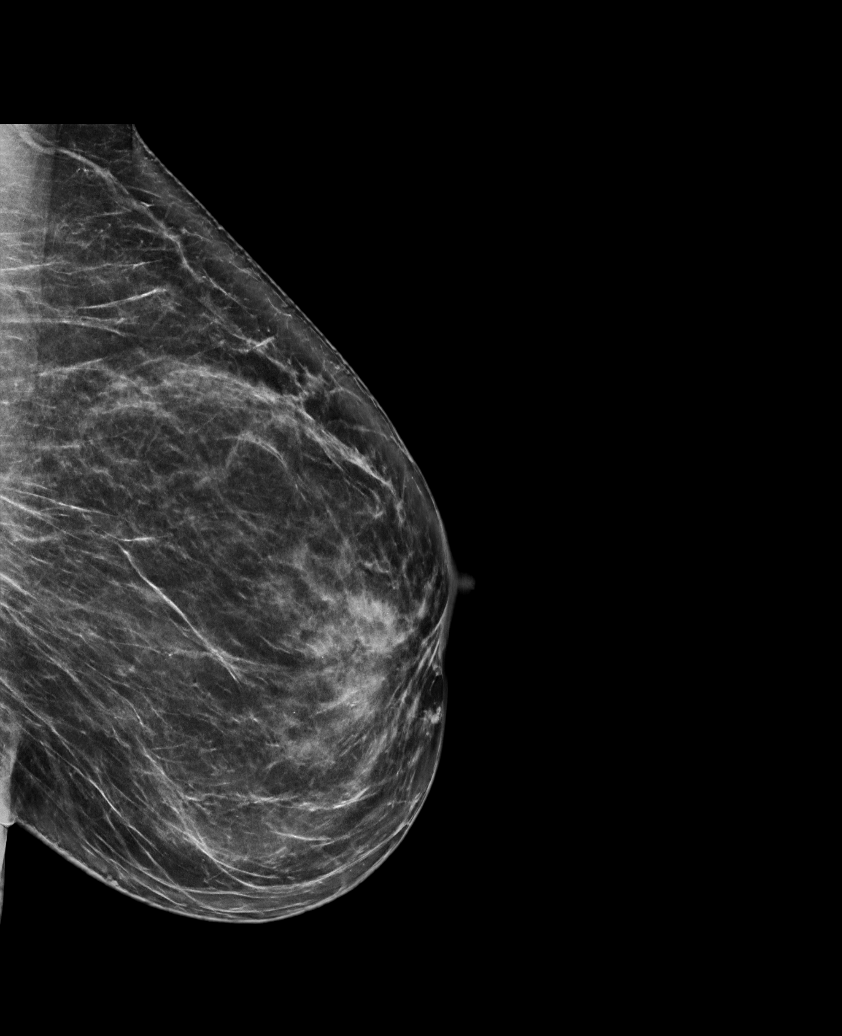

[L CC tomo · tomo slice 33/64.0]
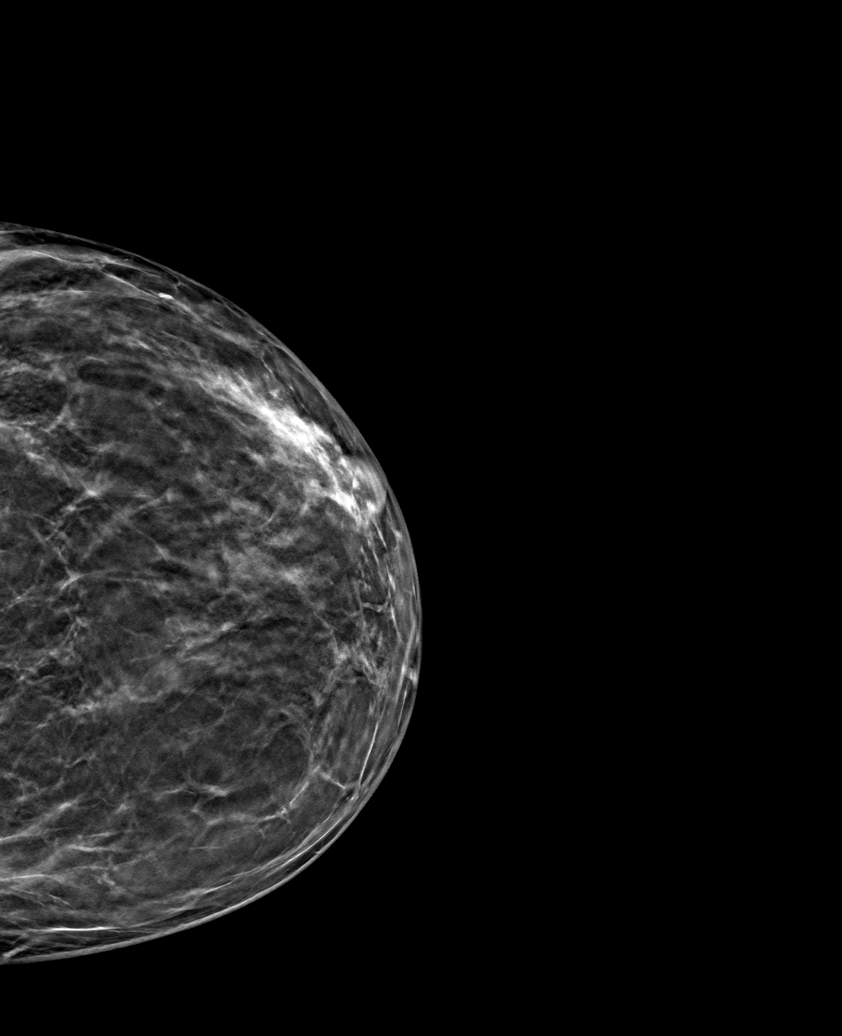

[6 of 30 positions shown; findings below may reference images not displayed]

ACR Breast Density Category b: There are scattered areas of
fibroglandular density.
FINDINGS: There are no findings suspicious for malignancy.
IMPRESSION: No mammographic evidence of malignancy. A result letter of this
screening mammogram will be mailed directly to the patient.

RECOMMENDATION:
Screening mammogram in one year. (Code:51-O-LD2)

BI-RADS CATEGORY  1: Negative.

## 2022-11-16 ENCOUNTER — Other Ambulatory Visit: Payer: Self-pay | Admitting: Physician Assistant

## 2022-11-16 DIAGNOSIS — Z1231 Encounter for screening mammogram for malignant neoplasm of breast: Secondary | ICD-10-CM

## 2022-11-19 ENCOUNTER — Ambulatory Visit
Admission: RE | Admit: 2022-11-19 | Discharge: 2022-11-19 | Disposition: A | Discharge status: Home / Self Care | Payer: BC Managed Care – PPO | Source: Ambulatory Visit | Attending: Physician Assistant | Admitting: Physician Assistant

## 2022-11-19 DIAGNOSIS — Z1231 Encounter for screening mammogram for malignant neoplasm of breast: Secondary | ICD-10-CM

## 2023-04-11 ENCOUNTER — Other Ambulatory Visit (HOSPITAL_COMMUNITY): Payer: Self-pay | Admitting: Physician Assistant

## 2023-04-11 DIAGNOSIS — E7800 Pure hypercholesterolemia, unspecified: Secondary | ICD-10-CM

## 2023-07-26 ENCOUNTER — Ambulatory Visit (HOSPITAL_COMMUNITY)
Admission: RE | Admit: 2023-07-26 | Discharge: 2023-07-26 | Disposition: A | Discharge status: Home / Self Care | Payer: Self-pay | Source: Ambulatory Visit | Attending: Physician Assistant | Admitting: Physician Assistant

## 2023-07-26 DIAGNOSIS — E7800 Pure hypercholesterolemia, unspecified: Secondary | ICD-10-CM | POA: Insufficient documentation

## 2023-12-17 ENCOUNTER — Other Ambulatory Visit: Payer: Self-pay | Admitting: Physician Assistant

## 2023-12-17 DIAGNOSIS — Z1231 Encounter for screening mammogram for malignant neoplasm of breast: Secondary | ICD-10-CM

## 2023-12-24 ENCOUNTER — Ambulatory Visit
Admission: RE | Admit: 2023-12-24 | Discharge: 2023-12-24 | Disposition: A | Discharge status: Home / Self Care | Payer: Self-pay | Source: Ambulatory Visit | Attending: Physician Assistant | Admitting: Physician Assistant

## 2023-12-24 DIAGNOSIS — Z1231 Encounter for screening mammogram for malignant neoplasm of breast: Secondary | ICD-10-CM
